# Patient Record
Sex: Male | Born: 1965 | Race: White | Hispanic: No | Marital: Married | Smoking: Current every day smoker
Health system: Southern US, Community
[De-identification: ages and names within clinical notes are randomized; demographics above are authoritative.]

## PROBLEM LIST (undated history)

## (undated) DIAGNOSIS — J449 Chronic obstructive pulmonary disease, unspecified: Secondary | ICD-10-CM

## (undated) DIAGNOSIS — I10 Essential (primary) hypertension: Secondary | ICD-10-CM

---

## 2014-07-22 ENCOUNTER — Encounter (HOSPITAL_COMMUNITY): Payer: Self-pay

## 2014-07-22 ENCOUNTER — Emergency Department (HOSPITAL_COMMUNITY)
Admission: EM | Admit: 2014-07-22 | Discharge: 2014-07-22 | Disposition: A | Discharge status: Home / Self Care | Payer: No Typology Code available for payment source | Attending: Emergency Medicine | Admitting: Emergency Medicine

## 2014-07-22 DIAGNOSIS — S29092A Other injury of muscle and tendon of back wall of thorax, initial encounter: Secondary | ICD-10-CM | POA: Diagnosis not present

## 2014-07-22 DIAGNOSIS — Z720 Tobacco use: Secondary | ICD-10-CM | POA: Insufficient documentation

## 2014-07-22 DIAGNOSIS — Y998 Other external cause status: Secondary | ICD-10-CM | POA: Insufficient documentation

## 2014-07-22 DIAGNOSIS — S0990XA Unspecified injury of head, initial encounter: Secondary | ICD-10-CM | POA: Insufficient documentation

## 2014-07-22 DIAGNOSIS — Y9389 Activity, other specified: Secondary | ICD-10-CM | POA: Diagnosis not present

## 2014-07-22 DIAGNOSIS — R51 Headache: Secondary | ICD-10-CM

## 2014-07-22 DIAGNOSIS — Y92410 Unspecified street and highway as the place of occurrence of the external cause: Secondary | ICD-10-CM | POA: Diagnosis not present

## 2014-07-22 DIAGNOSIS — M546 Pain in thoracic spine: Secondary | ICD-10-CM

## 2014-07-22 DIAGNOSIS — I10 Essential (primary) hypertension: Secondary | ICD-10-CM | POA: Insufficient documentation

## 2014-07-22 DIAGNOSIS — R519 Headache, unspecified: Secondary | ICD-10-CM

## 2014-07-22 HISTORY — DX: Essential (primary) hypertension: I10

## 2014-07-22 MED ORDER — IBUPROFEN 800 MG PO TABS: 800.0000 mg | ORAL_TABLET | Freq: Three times a day (TID) | ORAL | Status: AC

## 2014-07-22 MED ORDER — CYCLOBENZAPRINE HCL 10 MG PO TABS: 10.0000 mg | ORAL_TABLET | Freq: Two times a day (BID) | ORAL | Status: AC | PRN
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
## 2014-07-22 NOTE — Discharge Instructions (Signed)
°General Headache Without Cause °A headache is pain or discomfort felt around the head or neck area. The specific cause of a headache may not be found. There are many causes and types of headaches. A few common ones are: °· Tension headaches. °· Migraine headaches. °· Cluster headaches. °· Chronic daily headaches. °HOME CARE INSTRUCTIONS  °· Keep all follow-up appointments with your caregiver or any specialist referral. °· Only take over-the-counter or prescription medicines for pain or discomfort as directed by your caregiver. °· Lie down in a dark, quiet room when you have a headache. °· Keep a headache journal to find out what may trigger your migraine headaches. For example, write down: °¨ What you eat and drink. °¨ How much sleep you get. °¨ Any change to your diet or medicines. °· Try massage or other relaxation techniques. °· Put ice packs or heat on the head and neck. Use these 3 to 4 times per day for 15 to 20 minutes each time, or as needed. °· Limit stress. °· Sit up straight, and do not tense your muscles. °· Quit smoking if you smoke. °· Limit alcohol use. °· Decrease the amount of caffeine you drink, or stop drinking caffeine. °· Eat and sleep on a regular schedule. °· Get 7 to 9 hours of sleep, or as recommended by your caregiver. °· Keep lights dim if bright lights bother you and make your headaches worse. °SEEK MEDICAL CARE IF:  °· You have problems with the medicines you were prescribed. °· Your medicines are not working. °· You have a change from the usual headache. °· You have nausea or vomiting. °SEEK IMMEDIATE MEDICAL CARE IF:  °· Your headache becomes severe. °· You have a fever. °· You have a stiff neck. °· You have loss of vision. °· You have muscular weakness or loss of muscle control. °· You start losing your balance or have trouble walking. °· You feel faint or pass out. °· You have severe symptoms that are different from your first symptoms. °MAKE SURE YOU:  °· Understand these  instructions. °· Will watch your condition. °· Will get help right away if you are not doing well or get worse. °Document Released: 02/23/2005 Document Revised: 05/18/2011 Document Reviewed: 03/11/2011 °ExitCare® Patient Information ©2015 [employer]. This information is not intended to replace advice given to you by your health care provider. Make sure you discuss any questions you have with your health care provider. °Motor Vehicle Collision °It is common to have multiple bruises and sore muscles after a motor vehicle collision (MVC). These tend to feel worse for the first 24 hours. You may have the most stiffness and soreness over the first several hours. You may also feel worse when you wake up the first morning after your collision. After this point, you will usually begin to improve with each day. The speed of improvement often depends on the severity of the collision, the number of injuries, and the location and nature of these injuries. °HOME CARE INSTRUCTIONS °· Put ice on the injured area. °· Put ice in a plastic bag. °· Place a towel between your skin and the bag. °· Leave the ice on for 15-20 minutes, 3-4 times a day, or as directed by your health care provider. °· Drink enough fluids to keep your urine clear or pale yellow. Do not drink alcohol. °· Take a warm shower or bath once or twice a day. This will increase blood flow to sore muscles. °· You may return to   activities as directed by your caregiver. Be careful when lifting, as this may aggravate neck or back pain. °· Only take over-the-counter or prescription medicines for pain, discomfort, or fever as directed by your caregiver. Do not use aspirin. This may increase bruising and bleeding. °SEEK IMMEDIATE MEDICAL CARE IF: °· You have numbness, tingling, or weakness in the arms or legs. °· You develop severe headaches not relieved with medicine. °· You have severe neck pain, especially tenderness in the middle of the back of your neck. °· You have  changes in bowel or bladder control. °· There is increasing pain in any area of the body. °· You have shortness of breath, light-headedness, dizziness, or fainting. °· You have chest pain. °· You feel sick to your stomach (nauseous), throw up (vomit), or sweat. °· You have increasing abdominal discomfort. °· There is blood in your urine, stool, or vomit. °· You have pain in your shoulder (shoulder strap areas). °· You feel your symptoms are getting worse. °MAKE SURE YOU: °· Understand these instructions. °· Will watch your condition. °· Will get help right away if you are not doing well or get worse. °Document Released: 02/23/2005 Document Revised: 07/10/2013 Document Reviewed: 07/23/2010 °ExitCare® Patient Information ©2015 [employer]. This information is not intended to replace advice given to you by your health care provider. Make sure you discuss any questions you have with your health care provider. ° °

## 2014-09-06 ENCOUNTER — Emergency Department (HOSPITAL_COMMUNITY)
Admission: EM | Admit: 2014-09-06 | Discharge: 2014-09-06 | Disposition: A | Discharge status: Home / Self Care | Payer: Self-pay | Attending: Emergency Medicine | Admitting: Emergency Medicine

## 2014-09-06 ENCOUNTER — Encounter (HOSPITAL_COMMUNITY): Payer: Self-pay

## 2014-09-06 DIAGNOSIS — Z791 Long term (current) use of non-steroidal anti-inflammatories (NSAID): Secondary | ICD-10-CM | POA: Insufficient documentation

## 2014-09-06 DIAGNOSIS — I10 Essential (primary) hypertension: Secondary | ICD-10-CM | POA: Insufficient documentation

## 2014-09-06 DIAGNOSIS — Z720 Tobacco use: Secondary | ICD-10-CM | POA: Insufficient documentation

## 2014-09-06 DIAGNOSIS — H5711 Ocular pain, right eye: Secondary | ICD-10-CM | POA: Insufficient documentation

## 2014-09-06 DIAGNOSIS — H578 Other specified disorders of eye and adnexa: Secondary | ICD-10-CM | POA: Insufficient documentation

## 2014-09-06 DIAGNOSIS — H539 Unspecified visual disturbance: Secondary | ICD-10-CM | POA: Insufficient documentation

## 2014-09-06 MED ORDER — FLUORESCEIN SODIUM 1 MG OP STRP
1.0000 | ORAL_STRIP | Freq: Once | OPHTHALMIC | Status: AC
  Administered 2014-09-06: 1 via OPHTHALMIC
  Filled 2014-09-06: qty 1

## 2014-09-06 MED ORDER — ERYTHROMYCIN 5 MG/GM OP OINT
TOPICAL_OINTMENT | Freq: Four times a day (QID) | OPHTHALMIC | Status: DC
  Administered 2014-09-06: 1 via OPHTHALMIC
  Filled 2014-09-06: qty 3.5

## 2014-09-06 MED ORDER — TETRACAINE HCL 0.5 % OP SOLN
2.0000 [drp] | Freq: Once | OPHTHALMIC | Status: AC
  Administered 2014-09-06: 2 [drp] via OPHTHALMIC
  Filled 2014-09-06: qty 2

## 2014-09-06 NOTE — ED Provider Notes (Signed)
CSN: [id number]     Arrival date & time 09/06/14  1954 History  This chart was scribed for Brett Anis, PA-C working with Zadie Rhine, MD by Evon Slack, ED Scribe. This patient was seen in room WTR8/WTR8 and the patient's care was started at 8:54 PM.     Chief Complaint  Patient presents with  . Foreign Body in Eye   The history is provided by the patient. No language interpreter was used.   HPI Comments: Egypt Marchiano is a 49 y.o. male who presents to the Emergency Department complaining of foreign body in right eye onset 2 days prior. Pt states that he thinks he may have a piece of metal in his eye. Pt presents with associated right eye redness. Pt states that he used a magnet and removed one piece of metal that provided temporary relief. Pt states that his vision is a little blurred but denies loss of vision. Pt states that he was using a metal grander and small fragments came up under his face shield into the eye.   Past Medical History  Diagnosis Date  . Hypertension    History reviewed. No pertinent past surgical history. No family history on file. History  Substance Use Topics  . Smoking status: Current Every Day Smoker -- 1.00 packs/day    Types: Cigarettes  . Smokeless tobacco: Not on file  . Alcohol Use: No    Review of Systems  Eyes: Positive for pain, redness and visual disturbance.  All other systems reviewed and are negative.    Allergies  Review of patient's allergies indicates no known allergies.  Home Medications   Prior to Admission medications   Medication Sig Start Date End Date Taking? Authorizing Provider  cyclobenzaprine (FLEXERIL) 10 MG tablet Take 1 tablet (10 mg total) by mouth 2 (two) times daily as needed for muscle spasms. 07/22/14   Brett Anis, PA-C  ibuprofen (ADVIL,MOTRIN) 800 MG tablet Take 1 tablet (800 mg total) by mouth 3 (three) times daily. 07/22/14   Maxxon Schwanke, PA-C   BP 150/80 mmHg  Pulse 68  Temp(Src) 98.3 F  (36.8 C) (Oral)  Resp 16  SpO2 100%   Physical Exam  Constitutional: He is oriented to person, place, and time. He appears well-developed and well-nourished. No distress.  HENT:  Head: Normocephalic and atraumatic.  Eyes: EOM are normal. Right conjunctiva is injected.  Slit lamp exam:      The right eye shows no hyphema and no fluorescein uptake.  Right eye sclera injected no foreign bodies, fluorescein uptake negative,  Full range of extraocular motion, no hyphema.   Neck: Neck supple. No tracheal deviation present.  Cardiovascular: Normal rate.   Pulmonary/Chest: Effort normal. No respiratory distress.  Musculoskeletal: Normal range of motion.  Neurological: He is alert and oriented to person, place, and time.  Skin: Skin is warm and dry.  Psychiatric: He has a normal mood and affect. His behavior is normal.  Nursing note and vitals reviewed.   ED Course  Procedures (including critical care time) DIAGNOSTIC STUDIES: Oxygen Saturation is 100% on RA, normal by my interpretation.    COORDINATION OF CARE: 9:34 PM-Discussed treatment plan with pt at bedside and pt agreed to plan.     Labs Review Labs Reviewed - No data to display  Imaging Review No results found.   EKG Interpretation None      MDM   Final diagnoses:  None   1. Right eye pain  No foreign body observed. No  corneal abrasion or ulceration. Will provide erythromycin ointment and ophtho follow up if symptoms persist.    I personally performed the services described in this documentation, which was scribed in my presence. The recorded information has been reviewed and is accurate.       Brett AnisShari Shirleyann Montero, PA-C 09/08/14 [id number]  Zadie Rhineonald Wickline, MD 09/12/14 [phone]

## 2014-09-06 NOTE — ED Notes (Signed)
Pt presents with c/o foreign object in his right eye. Pt reports he works [employer] and approx 2-3 days ago, he got some pieces of steel stuck in his eye. Pt reports he was able to extract one piece but believes there is more in there.

## 2014-09-06 NOTE — Discharge Instructions (Signed)
FOLLOW UP WITH DR. PATEL IF NO BETTER IN ANOTHER DAY. USE ERYTHROMYCIN OINTMENT AS PRESCRIBED. RETURN HERE WITH ANY WORSENING SYMPTOMS OR NEW CONCERNS.

## 2015-10-25 ENCOUNTER — Other Ambulatory Visit: Payer: Self-pay | Admitting: Family Medicine

## 2015-10-25 DIAGNOSIS — R911 Solitary pulmonary nodule: Secondary | ICD-10-CM

## 2015-11-13 ENCOUNTER — Ambulatory Visit
Admission: RE | Admit: 2015-11-13 | Discharge: 2015-11-13 | Disposition: A | Discharge status: Home / Self Care | Payer: Managed Care, Other (non HMO) | Source: Ambulatory Visit | Attending: Family Medicine | Admitting: Family Medicine

## 2015-11-13 DIAGNOSIS — R911 Solitary pulmonary nodule: Secondary | ICD-10-CM | POA: Diagnosis present

## 2015-11-13 DIAGNOSIS — J439 Emphysema, unspecified: Secondary | ICD-10-CM | POA: Diagnosis not present

## 2015-11-13 DIAGNOSIS — J470 Bronchiectasis with acute lower respiratory infection: Secondary | ICD-10-CM | POA: Diagnosis not present

## 2015-11-15 ENCOUNTER — Other Ambulatory Visit: Payer: Self-pay | Admitting: Family Medicine

## 2015-11-15 DIAGNOSIS — R911 Solitary pulmonary nodule: Secondary | ICD-10-CM

## 2016-02-11 ENCOUNTER — Ambulatory Visit: Admission: RE | Admit: 2016-02-11 | Payer: Self-pay | Source: Ambulatory Visit

## 2017-05-07 DEATH — deceased

## 2017-10-06 ENCOUNTER — Emergency Department
Admission: EM | Admit: 2017-10-06 | Discharge: 2017-10-06 | Disposition: A | Discharge status: Home / Self Care | Payer: Self-pay | Attending: Student in an Organized Health Care Education/Training Program | Admitting: Student in an Organized Health Care Education/Training Program

## 2017-10-06 ENCOUNTER — Emergency Department: Payer: Self-pay

## 2017-10-06 ENCOUNTER — Other Ambulatory Visit: Payer: Self-pay

## 2017-10-06 DIAGNOSIS — R1084 Generalized abdominal pain: Secondary | ICD-10-CM | POA: Insufficient documentation

## 2017-10-06 DIAGNOSIS — I10 Essential (primary) hypertension: Secondary | ICD-10-CM | POA: Insufficient documentation

## 2017-10-06 DIAGNOSIS — F17210 Nicotine dependence, cigarettes, uncomplicated: Secondary | ICD-10-CM | POA: Insufficient documentation

## 2017-10-06 DIAGNOSIS — N201 Calculus of ureter: Secondary | ICD-10-CM

## 2017-10-06 DIAGNOSIS — N132 Hydronephrosis with renal and ureteral calculous obstruction: Secondary | ICD-10-CM | POA: Insufficient documentation

## 2017-10-06 LAB — URINALYSIS, COMPLETE (UACMP) WITH MICROSCOPIC
Bacteria, UA: NONE SEEN
SPECIFIC GRAVITY, URINE: 1.026 (ref 1.005–1.030)
Squamous Epithelial / LPF: NONE SEEN (ref 0–5)

## 2017-10-06 LAB — CBC
HCT: 45.7 % (ref 40.0–52.0)
Hemoglobin: 15.8 g/dL (ref 13.0–18.0)
MCH: 30.6 pg (ref 26.0–34.0)
MCHC: 34.6 g/dL (ref 32.0–36.0)
MCV: 88.6 fL (ref 80.0–100.0)
Platelets: 292 10*3/uL (ref 150–440)
RBC: 5.16 MIL/uL (ref 4.40–5.90)
RDW: 12.7 % (ref 11.5–14.5)
WBC: 9.5 10*3/uL (ref 3.8–10.6)

## 2017-10-06 LAB — COMPREHENSIVE METABOLIC PANEL
ALT: 19 U/L (ref 0–44)
AST: 21 U/L (ref 15–41)
Albumin: 4.5 g/dL (ref 3.5–5.0)
Alkaline Phosphatase: 83 U/L (ref 38–126)
Anion gap: 8 (ref 5–15)
BUN: 16 mg/dL (ref 6–20)
CO2: 30 mmol/L (ref 22–32)
Calcium: 9.4 mg/dL (ref 8.9–10.3)
Chloride: 103 mmol/L (ref 98–111)
Creatinine, Ser: 1 mg/dL (ref 0.61–1.24)
GFR calc non Af Amer: >60 mL/min (ref >60)
Glucose, Bld: 149 mg/dL — ABNORMAL HIGH (ref 70–99)
Potassium: 4.5 mmol/L (ref 3.5–5.1)
Sodium: 141 mmol/L (ref 135–145)
Total Bilirubin: 0.5 mg/dL (ref 0.3–1.2)
Total Protein: 7.4 g/dL (ref 6.5–8.1)

## 2017-10-06 LAB — TROPONIN I: Troponin I: <0.03 ng/mL (ref <0.03)

## 2017-10-06 MED ORDER — PROCHLORPERAZINE MALEATE 10 MG PO TABS: 10.0000 mg | ORAL_TABLET | Freq: Four times a day (QID) | ORAL | 0 refills | Status: AC | PRN

## 2017-10-06 MED ORDER — MORPHINE SULFATE (PF) 4 MG/ML IV SOLN
4.0000 mg | INTRAVENOUS | Status: DC | PRN
  Administered 2017-10-06: 4 mg via INTRAVENOUS
  Filled 2017-10-06: qty 1

## 2017-10-06 MED ORDER — TAMSULOSIN HCL 0.4 MG PO CAPS: 0.4000 mg | ORAL_CAPSULE | Freq: Every day | ORAL | 0 refills | Status: AC

## 2017-10-06 MED ORDER — KETOROLAC TROMETHAMINE 30 MG/ML IJ SOLN
15.0000 mg | Freq: Once | INTRAMUSCULAR | Status: AC
  Administered 2017-10-06: 15 mg via INTRAVENOUS
  Filled 2017-10-06: qty 1

## 2017-10-06 MED ORDER — ONDANSETRON HCL 4 MG/2ML IJ SOLN
4.0000 mg | Freq: Once | INTRAMUSCULAR | Status: AC
Start: 2017-10-06 — End: 2017-10-06
  Administered 2017-10-06: 4 mg via INTRAVENOUS
  Filled 2017-10-06: qty 2

## 2017-10-06 MED ORDER — HYDROCODONE-ACETAMINOPHEN 5-325 MG PO TABS: 1.0000 | ORAL_TABLET | ORAL | 0 refills | Status: AC | PRN

## 2017-10-06 NOTE — ED Triage Notes (Signed)
Pt c/o left flank pain that started suddenly around 8am with nausea and diaphoresis. Denies hx of kidney stones

## 2017-10-06 NOTE — ED Provider Notes (Signed)
[facility] Emergency Department Provider Note    First MD Initiated Contact with Patient 10/06/17 1106     (approximate)  I have reviewed the triage vital signs and the nursing notes.   HISTORY  Chief Complaint Flank Pain    HPI Brett Mathis is a 52 y.o. male who presents to the ER with c/c of left flank pain.  Sx started this AM and awoke him from sleep at home..  Severe and radiating to left groin.  Never had similar sx in the past.  Denies fever, dysuria.  + hematuria.  No CP or SOB.     No recent medication changes.  He does smoke.  Does not report a history of hypertension but does not take any medications for this.   Past Medical History:  Diagnosis Date  . Hypertension    No family history on file. History reviewed. No pertinent surgical history. There are no active problems to display for this patient.     Prior to Admission medications   Medication Sig Start Date End Date Taking? Authorizing Provider  cyclobenzaprine (FLEXERIL) 10 MG tablet Take 1 tablet (10 mg total) by mouth 2 (two) times daily as needed for muscle spasms. Patient not taking: Reported on 10/06/2017 07/22/14   Elpidio Anis, PA-C  ibuprofen (ADVIL,MOTRIN) 800 MG tablet Take 1 tablet (800 mg total) by mouth 3 (three) times daily. Patient not taking: Reported on 09/06/2014 07/22/14   Elpidio Anis, PA-C    Allergies Patient has no known allergies.    Social History Social History   Tobacco Use  . Smoking status: Current Every Day Smoker    Packs/day: 1.00    Types: Cigarettes  . Smokeless tobacco: Never Used  Substance Use Topics  . Alcohol use: No  . Drug use: No    Review of Systems Patient denies headaches, rhinorrhea, blurry vision, numbness, shortness of breath, chest pain, edema, cough, abdominal pain, nausea, vomiting, diarrhea, dysuria, fevers, rashes or hallucinations unless otherwise stated above in  HPI. ____________________________________________   PHYSICAL EXAM:  VITAL SIGNS: Vitals:   10/06/17 1300 10/06/17 1400  BP: 127/83 (!) 105/53  Pulse: (!) 44 (!) 42  Resp: 15 14  Temp:    SpO2: 98% 99%    Constitutional: Alert and oriented.  Eyes: Conjunctivae are normal.  Head: Atraumatic. Nose: No congestion/rhinnorhea. Mouth/Throat: Mucous membranes are moist.   Neck: No stridor. Painless ROM.  Cardiovascular: Normal rate, regular rhythm. Grossly normal heart sounds.  Good peripheral circulation. Respiratory: Normal respiratory effort.  No retractions. Lungs CTAB. Gastrointestinal: Soft and nontender. No distention. No abdominal bruits. + left CVA tenderness. Genitourinary:  Musculoskeletal: No lower extremity tenderness nor edema.  No joint effusions. Neurologic:  Normal speech and language. No gross focal neurologic deficits are appreciated. No facial droop Skin:  Skin is warm, dry and intact. No rash noted. Psychiatric: Mood and affect are normal. Speech and behavior are normal.  ____________________________________________   LABS (all labs ordered are listed, but only abnormal results are displayed)  Results for orders placed or performed during the hospital encounter of 10/06/17 (from the past 24 hour(s))  Comprehensive metabolic panel     Status: Abnormal   Collection Time: 10/06/17  9:56 AM  Result Value Ref Range   Sodium 141 135 - 145 mmol/L   Potassium 4.5 3.5 - 5.1 mmol/L   Chloride 103 98 - 111 mmol/L   CO2 30 22 - 32 mmol/L   Glucose, Bld 149 (H) 70 -  99 mg/dL   BUN 16 6 - 20 mg/dL   Creatinine, Ser 1.611.00 0.61 - 1.24 mg/dL   Calcium 9.4 8.9 - 09.610.3 mg/dL   Total Protein 7.4 6.5 - 8.1 g/dL   Albumin 4.5 3.5 - 5.0 g/dL   AST 21 15 - 41 U/L   ALT 19 0 - 44 U/L   Alkaline Phosphatase 83 38 - 126 U/L   Total Bilirubin 0.5 0.3 - 1.2 mg/dL   GFR calc non Af Amer >60 >60 mL/min   GFR calc Af Amer >60 >60 mL/min   Anion gap 8 5 - 15  CBC     Status: None    Collection Time: 10/06/17  9:56 AM  Result Value Ref Range   WBC 9.5 3.8 - 10.6 K/uL   RBC 5.16 4.40 - 5.90 MIL/uL   Hemoglobin 15.8 13.0 - 18.0 g/dL   HCT 04.545.7 40.940.0 - 81.152.0 %   MCV 88.6 80.0 - 100.0 fL   MCH 30.6 26.0 - 34.0 pg   MCHC 34.6 32.0 - 36.0 g/dL   RDW 91.412.7 78.211.5 - 95.614.5 %   Platelets 292 150 - 440 K/uL  Urinalysis, Complete w Microscopic     Status: Abnormal   Collection Time: 10/06/17  9:56 AM  Result Value Ref Range   Color, Urine AMBER (A) YELLOW   APPearance CLOUDY (A) CLEAR   Specific Gravity, Urine 1.026 1.005 - 1.030   pH  5.0 - 8.0    TEST NOT REPORTED DUE TO COLOR INTERFERENCE OF URINE PIGMENT   Glucose, UA (A) NEGATIVE mg/dL    TEST NOT REPORTED DUE TO COLOR INTERFERENCE OF URINE PIGMENT   Hgb urine dipstick (A) NEGATIVE    TEST NOT REPORTED DUE TO COLOR INTERFERENCE OF URINE PIGMENT   Bilirubin Urine (A) NEGATIVE    TEST NOT REPORTED DUE TO COLOR INTERFERENCE OF URINE PIGMENT   Ketones, ur (A) NEGATIVE mg/dL    TEST NOT REPORTED DUE TO COLOR INTERFERENCE OF URINE PIGMENT   Protein, ur (A) NEGATIVE mg/dL    TEST NOT REPORTED DUE TO COLOR INTERFERENCE OF URINE PIGMENT   Nitrite (A) NEGATIVE    TEST NOT REPORTED DUE TO COLOR INTERFERENCE OF URINE PIGMENT   Leukocytes, UA (A) NEGATIVE    TEST NOT REPORTED DUE TO COLOR INTERFERENCE OF URINE PIGMENT   RBC / HPF >50 (H) 0 - 5 RBC/hpf   WBC, UA 0-5 0 - 5 WBC/hpf   Bacteria, UA NONE SEEN NONE SEEN   Squamous Epithelial / LPF NONE SEEN 0 - 5   Mucus PRESENT    Budding Yeast PRESENT   Troponin I     Status: None   Collection Time: 10/06/17  9:56 AM  Result Value Ref Range   Troponin I <0.03 <0.03 ng/mL   ____________________________________________  EKG My review and personal interpretation at Time: 13:29   Indication: flank pain  Rate: 60  Rhythm: sinus Axis: normal Other: nonspecific st and t wave change,  No STEMI, twave abnormality in v3 concerning for wellens   My review and personal interpretation  at Time: 14:51 Indication: flank pain  Rate: 60  Rhythm: sinus Axis: normal Other:   No dynamic changes as compared to previous  ____________________________________________  RADIOLOGY   ____________________________________________   PROCEDURES  Procedure(s) performed:  Procedures    Critical Care performed: no ____________________________________________   INITIAL IMPRESSION / ASSESSMENT AND PLAN / ED COURSE  Pertinent labs & imaging results that were available during my  care of the patient were reviewed by me and considered in my medical decision making (see chart for details).   DDX: stone, pyelo, colitis, AAA,  Dysrhythmia   Eschol Auxier is a 52 y.o. who presents to the ED with presents the ER chief complaint of left flank pain.  Clinically is most consistent with kidney stone.  CT imaging ordered for the above differential does show evidence of left 4 mm stone.  No evidence of infection.  Renal function otherwise at baseline.  Patient's pain controlled with IV pain medication.  Patient noted to have brief episode of bradycardia and to rule out heart block EKG ordered which did show T wave abnormality with no previous.  Patient does have risk factors for heart disease but denies any chest pain or pressure.  States he had previous nuclear med study in [place] that was normal but this is probably 3 years ago he states.  Discussed EKG abnormality with Dr. Darrold Junker of cardiology.  Certainly atypical presentation for ACS.  Certainly may be his baseline but no previous.  Repeat EKG shows no dynamic changes will repeat troponin and if stable and negative patient will have follow-up with cardiology for further evaluation.        As part of my medical decision making, I reviewed the following data within the electronic MEDICAL RECORD NUMBER Nursing notes reviewed and incorporated, Labs reviewed, notes from prior ED  visits.   ____________________________________________   FINAL CLINICAL IMPRESSION(S) / ED DIAGNOSES  Final diagnoses:  Ureterolithiasis      NEW MEDICATIONS STARTED DURING THIS VISIT:  New Prescriptions   No medications on file     Note:  This document was prepared using Dragon voice recognition software and may include unintentional dictation errors.    Willy Eddy, MD 10/06/17 1501

## 2017-10-06 NOTE — ED Notes (Signed)
Patient transported to CT 

## 2017-10-06 NOTE — ED Notes (Signed)
Lab notified to add on Troponin. 

## 2017-10-06 NOTE — ED Notes (Signed)
Pt heart rate dropped to 38, EKG performed, EDP notified.

## 2017-10-06 NOTE — Discharge Instructions (Signed)
Please follow up with cardiology regarding further evaluation of your abnormal EKG.  Return Immediately for any chest pain, pressure or shortness of breath.

## 2018-10-30 ENCOUNTER — Other Ambulatory Visit: Payer: Self-pay

## 2018-10-30 ENCOUNTER — Encounter (HOSPITAL_COMMUNITY): Payer: Self-pay | Admitting: Emergency Medicine

## 2018-10-30 ENCOUNTER — Emergency Department (HOSPITAL_COMMUNITY)
Admission: EM | Admit: 2018-10-30 | Discharge: 2018-10-31 | Disposition: A | Discharge status: Home / Self Care | Payer: Self-pay | Attending: Emergency Medicine | Admitting: Emergency Medicine

## 2018-10-30 ENCOUNTER — Emergency Department (HOSPITAL_COMMUNITY): Payer: Self-pay

## 2018-10-30 DIAGNOSIS — Z5321 Procedure and treatment not carried out due to patient leaving prior to being seen by health care provider: Secondary | ICD-10-CM | POA: Insufficient documentation

## 2018-10-30 DIAGNOSIS — R05 Cough: Secondary | ICD-10-CM | POA: Insufficient documentation

## 2018-10-30 HISTORY — DX: Chronic obstructive pulmonary disease, unspecified: J44.9

## 2018-10-30 LAB — CBC WITH DIFFERENTIAL/PLATELET
Abs Immature Granulocytes: 0.02 10*3/uL (ref 0.00–0.07)
Basophils Absolute: 0 10*3/uL (ref 0.0–0.1)
Basophils Relative: 0 %
Eosinophils Absolute: 0.2 10*3/uL (ref 0.0–0.5)
Eosinophils Relative: 2 %
HCT: 35.4 % — ABNORMAL LOW (ref 39.0–52.0)
Hemoglobin: 11.8 g/dL — ABNORMAL LOW (ref 13.0–17.0)
Immature Granulocytes: 0 %
Lymphocytes Relative: 19 %
Lymphs Abs: 1.7 10*3/uL (ref 0.7–4.0)
MCH: 29.9 pg (ref 26.0–34.0)
MCHC: 33.3 g/dL (ref 30.0–36.0)
MCV: 89.8 fL (ref 80.0–100.0)
Monocytes Absolute: 0.6 10*3/uL (ref 0.1–1.0)
Monocytes Relative: 7 %
Neutro Abs: 6.1 10*3/uL (ref 1.7–7.7)
Neutrophils Relative %: 72 %
Platelets: 411 10*3/uL — ABNORMAL HIGH (ref 150–400)
RBC: 3.94 MIL/uL — ABNORMAL LOW (ref 4.22–5.81)
RDW: 12.3 % (ref 11.5–15.5)
WBC: 8.6 10*3/uL (ref 4.0–10.5)
nRBC: 0 % (ref 0.0–0.2)

## 2018-10-30 LAB — COMPREHENSIVE METABOLIC PANEL
ALT: 30 U/L (ref 0–44)
AST: 32 U/L (ref 15–41)
Albumin: 2.5 g/dL — ABNORMAL LOW (ref 3.5–5.0)
Alkaline Phosphatase: 69 U/L (ref 38–126)
Anion gap: 12 (ref 5–15)
BUN: 11 mg/dL (ref 6–20)
CO2: 25 mmol/L (ref 22–32)
Calcium: 7.9 mg/dL — ABNORMAL LOW (ref 8.9–10.3)
Chloride: 104 mmol/L (ref 98–111)
Creatinine, Ser: 0.83 mg/dL (ref 0.61–1.24)
GFR calc Af Amer: >60 mL/min (ref >60)
GFR calc non Af Amer: >60 mL/min (ref >60)
Glucose, Bld: 150 mg/dL — ABNORMAL HIGH (ref 70–99)
Potassium: 3.4 mmol/L — ABNORMAL LOW (ref 3.5–5.1)
Sodium: 141 mmol/L (ref 135–145)
Total Bilirubin: 0.3 mg/dL (ref 0.3–1.2)
Total Protein: 5.8 g/dL — ABNORMAL LOW (ref 6.5–8.1)

## 2018-10-30 NOTE — ED Triage Notes (Signed)
Patient reports persistent productive cough for 1 month ( COPD) , he has not taken his inhalers for several months , denies fever or chills / heavy smoker.

## 2018-10-31 NOTE — ED Notes (Signed)
Pt called for room no answer in the lobby

## 2018-10-31 NOTE — ED Notes (Signed)
Called for Pt to take to RM. No answer

## 2019-10-30 IMAGING — CT CT RENAL STONE PROTOCOL
2 of 4 series · 16 of 46 positions shown, 18 images · non-contrast
Comparison: None.

CLINICAL DATA: Left flank pain.

EXAM:
CT ABDOMEN AND PELVIS WITHOUT CONTRAST
TECHNIQUE: Multidetector CT imaging of the abdomen and pelvis was performed
following the standard protocol without IV contrast.

[Series 2: stone full standard · axial · 0.69mm/px · z∈[-749,-374]mm · 13 of 83 slices shown, 15 images]
[im 4/83  soft-tissue]
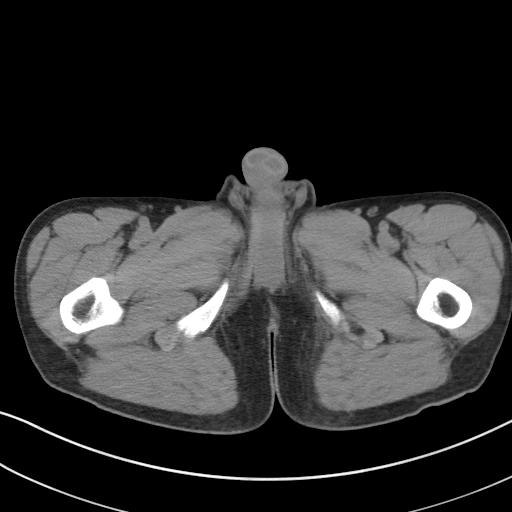
[im 4/83  bone]
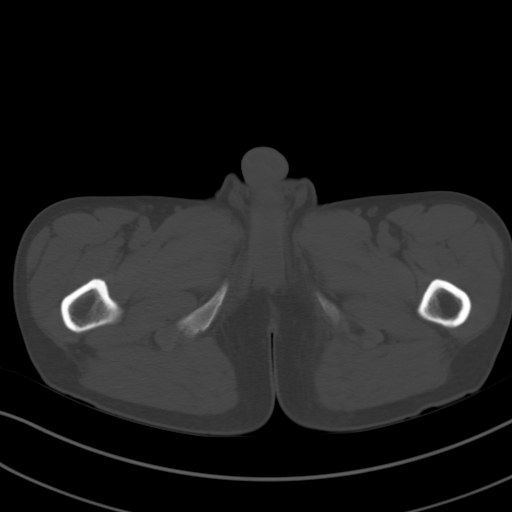
[im 10/83  soft-tissue]
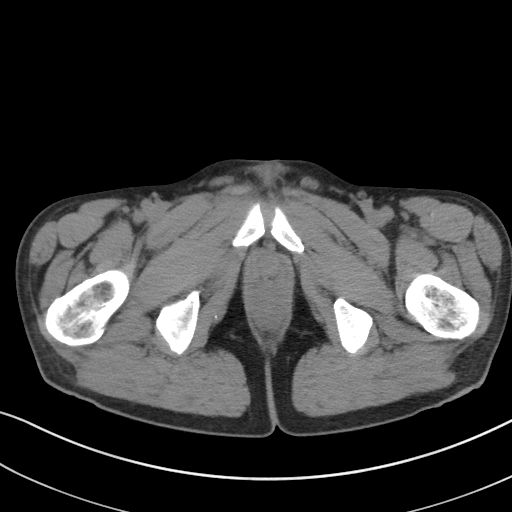
[im 17/83  soft-tissue]
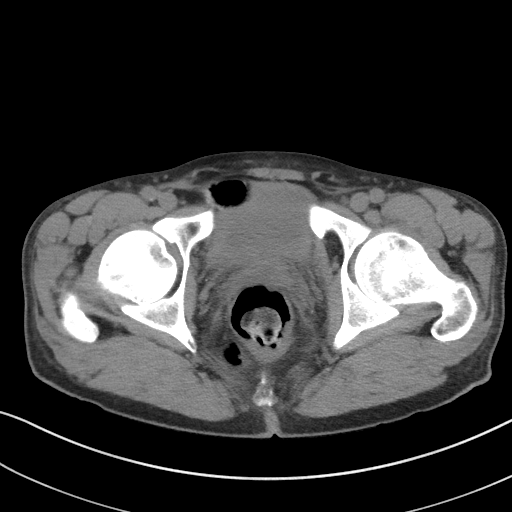
[im 23/83  soft-tissue]
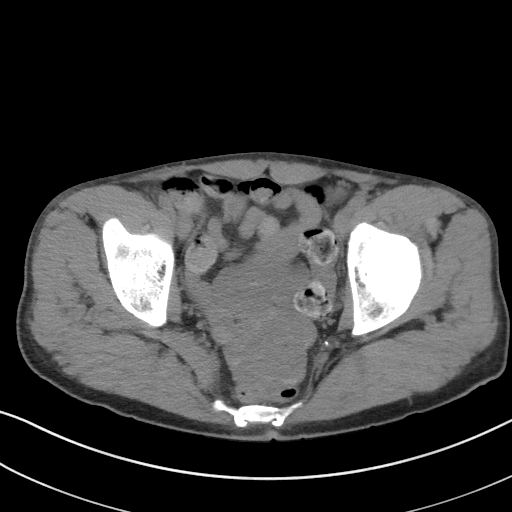
[im 30/83  soft-tissue]
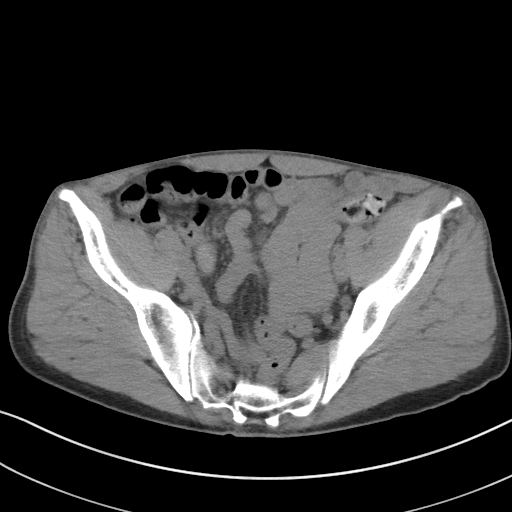
[im 37/83  soft-tissue]
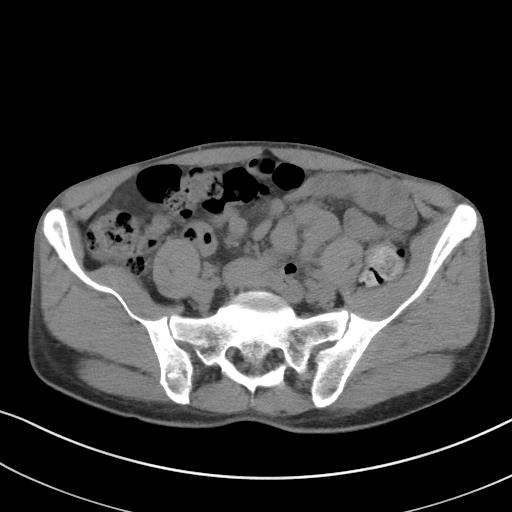
[im 43/83  soft-tissue]
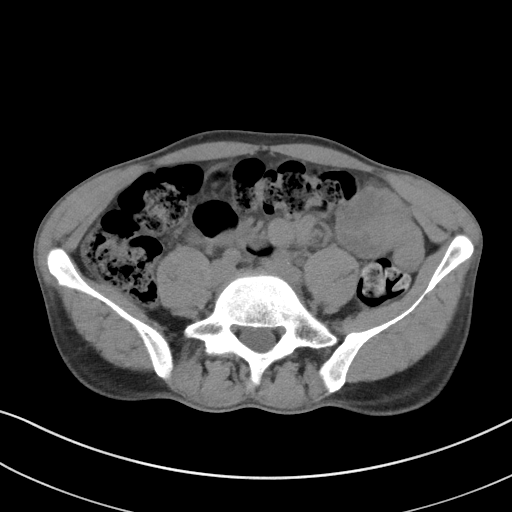
[im 46/83  soft-tissue]
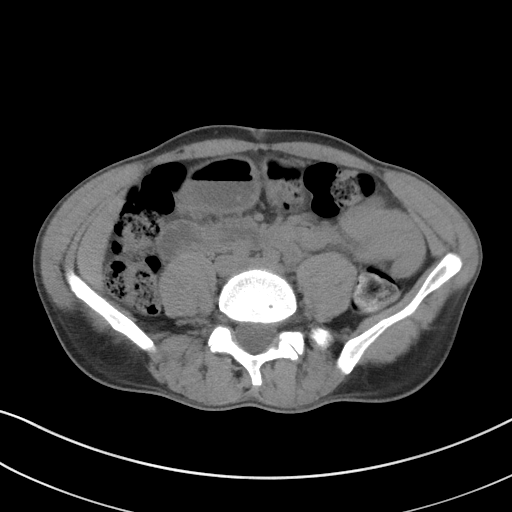
[im 53/83  soft-tissue]
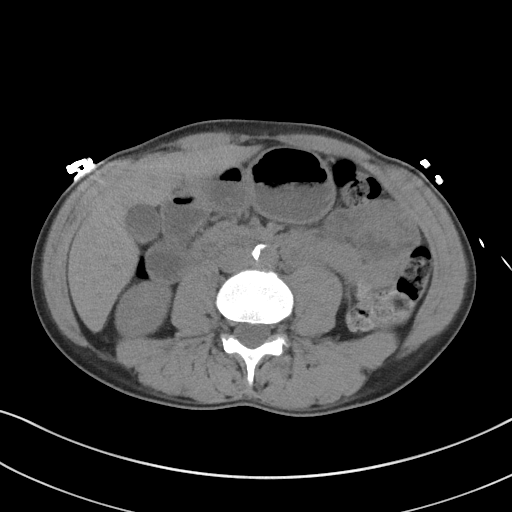
[im 53/83  bone]
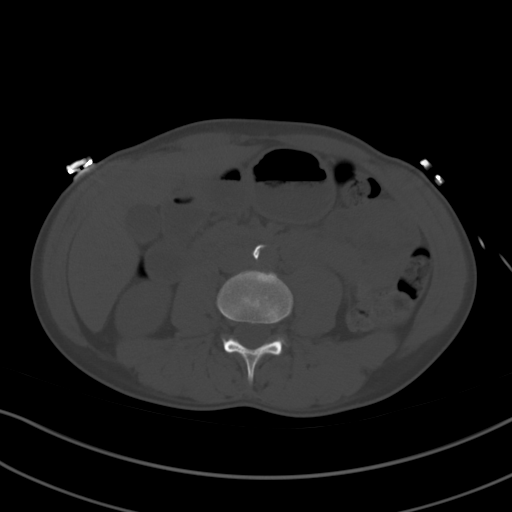
[im 60/83  soft-tissue]
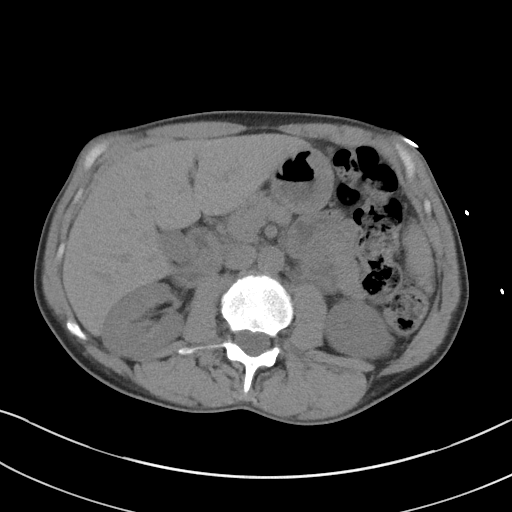
[im 66/83  soft-tissue]
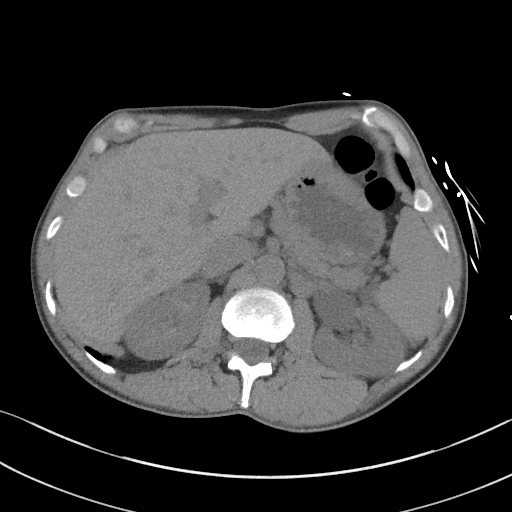
[im 73/83  soft-tissue]
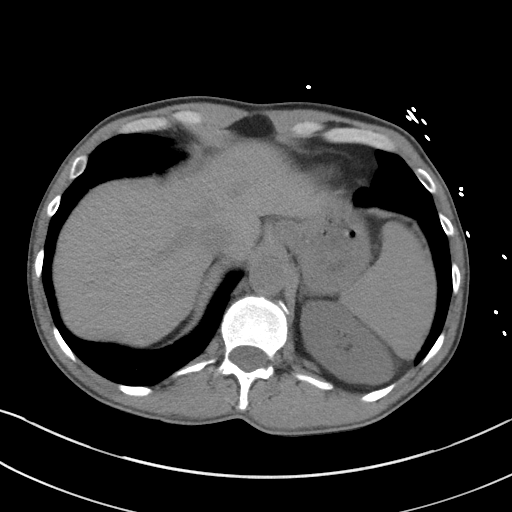
[im 79/83  soft-tissue]
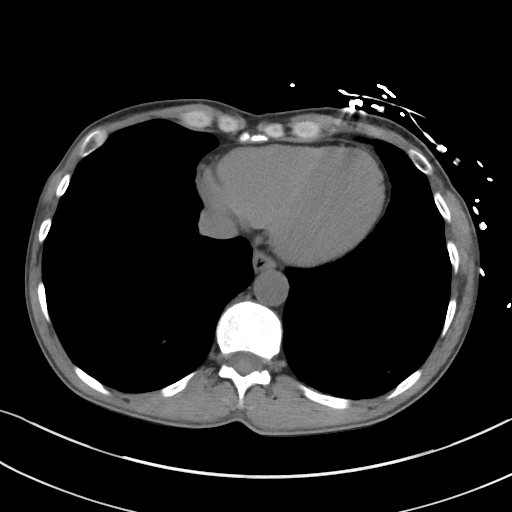

[Series 5: coronal · coronal · 0.67mm/px · 3 of 107 slices shown]
[im 36/107  soft-tissue]
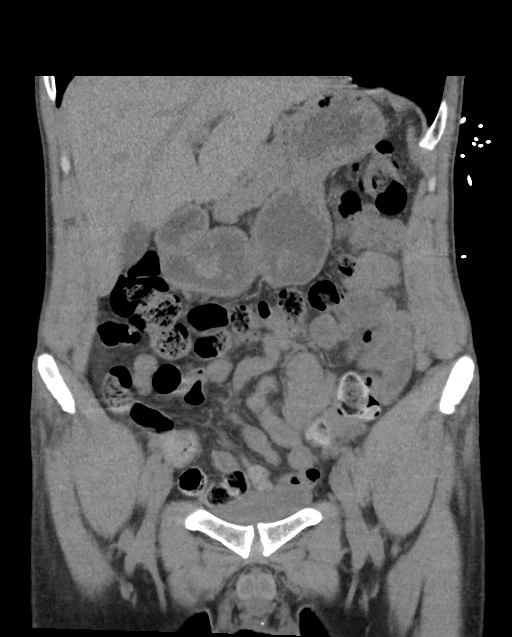
[im 48/107  soft-tissue]
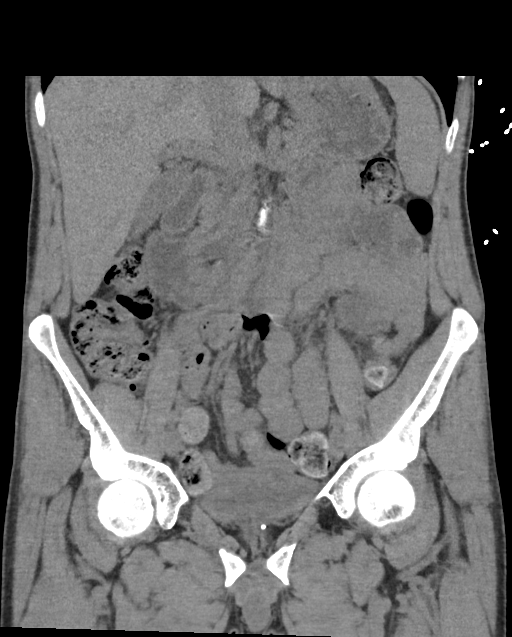
[im 59/107  soft-tissue]
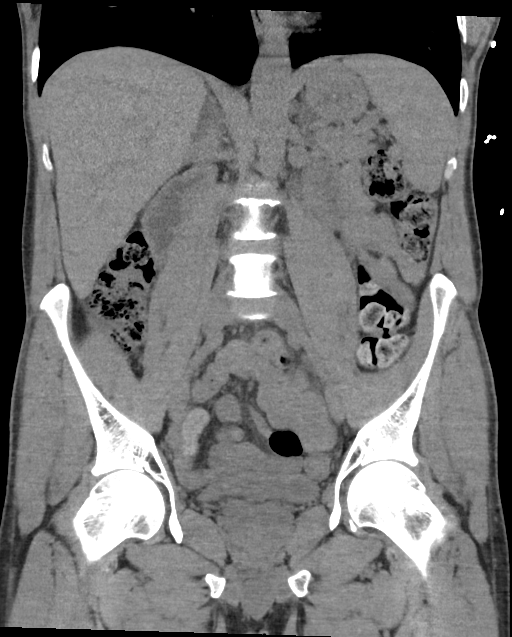

[16 of 46 positions shown; findings below may reference images not displayed]

FINDINGS: Lower chest: No acute abnormality. Fat containing posterior left
diaphragmatic hernia.

Hepatobiliary: No focal liver abnormality is seen. No gallstones,
gallbladder wall thickening, or biliary dilatation.

Pancreas: Unremarkable. No pancreatic ductal dilatation or
surrounding inflammatory changes.

Spleen: Normal in size without focal abnormality.

Adrenals/Urinary Tract: Normal bilateral adrenal glands, normal
right kidney, right ureter and urinary bladder. There is an
obstructing 4 mm distal left ureteral calculus, approximately 1 cm
from the vesicoureteral junction, causing mild upstream hydroureter
and hydronephrosis. Mild left perinephric fat stranding.

Stomach/Bowel: Stomach is within normal limits. Appendix appears
normal. No evidence of bowel wall thickening, distention, or
inflammatory changes.

Vascular/Lymphatic: Aortic atherosclerosis. No enlarged abdominal or
pelvic lymph nodes.

Reproductive: Prostate is unremarkable.

Other: No abdominal wall hernia or abnormality. No abdominopelvic
ascites.

Musculoskeletal: Mild spondylosis of the lumbosacral spine.
IMPRESSION: Obstructing 4 mm distal left ureteral calculus, approximately 1 cm
from the vesicoureteral junction, causing mild upstream left
hydroureter and hydronephrosis.

## 2020-11-22 IMAGING — DX CHEST - 2 VIEW
2 series · 2 of 2 positions shown · non-contrast
Comparison: Chest CT 11/13/2015

CLINICAL DATA: Cough

EXAM:
CHEST - 2 VIEW

[chest pa]
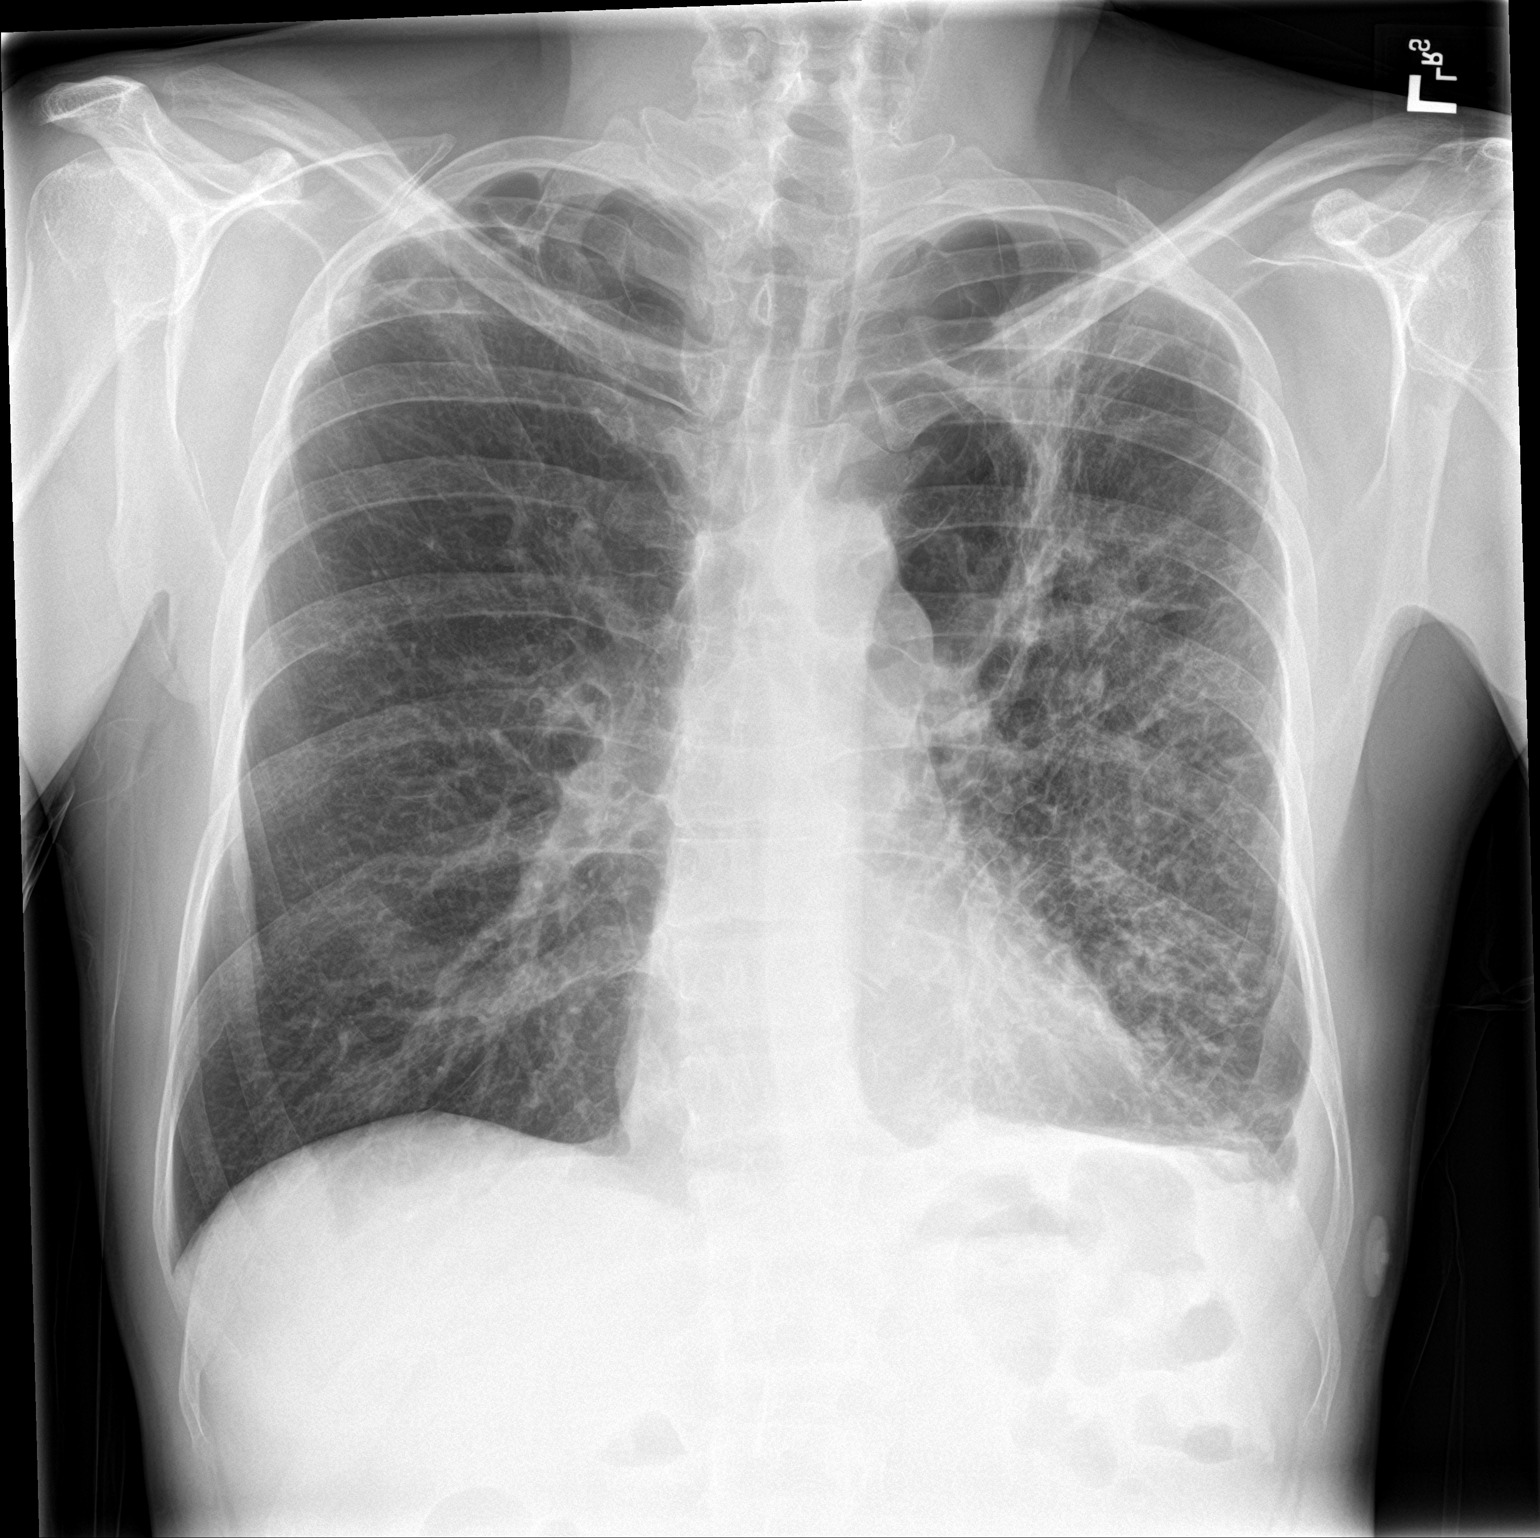

[chest lat]
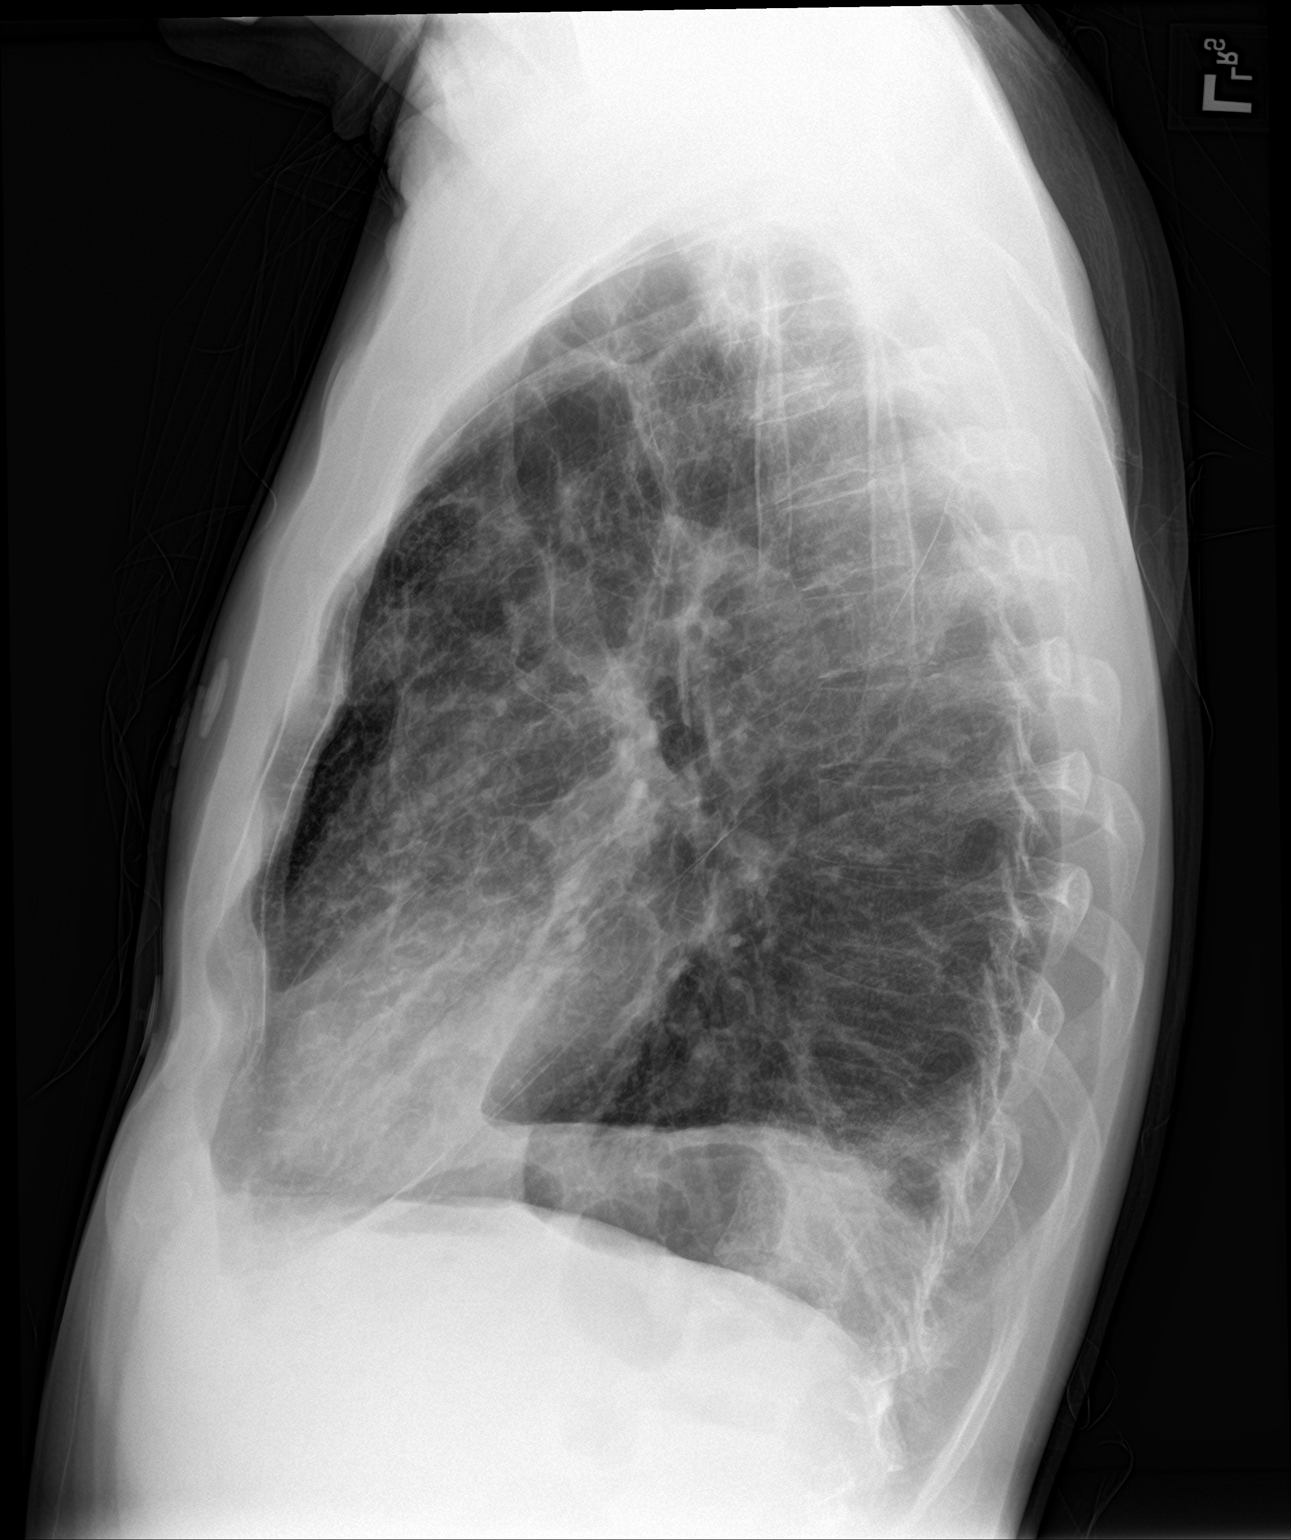

[2 of 2 positions shown; findings below may reference images not displayed]

FINDINGS: The lungs are hyperinflated with diffuse interstitial prominence.
There is parenchymal scarring throughout much of the left lung,
particularly at the left upper lobe. No pleural effusion or
pneumothorax. Normal cardiomediastinal contours.
IMPRESSION: COPD with left-greater-than-right scarring. No acute airspace
disease.
